# Patient Record
Sex: Male | Born: 1980 | Race: Black or African American | Hispanic: No | Marital: Single | Smoking: Never smoker
Health system: Southern US, Community
[De-identification: ages and names within clinical notes are randomized; demographics above are authoritative.]

## PROBLEM LIST (undated history)

## (undated) DIAGNOSIS — K5090 Crohn's disease, unspecified, without complications: Secondary | ICD-10-CM

## (undated) DIAGNOSIS — I10 Essential (primary) hypertension: Secondary | ICD-10-CM

## (undated) HISTORY — PX: ABDOMINAL SURGERY: SHX537

---

## 2001-08-27 ENCOUNTER — Ambulatory Visit (HOSPITAL_COMMUNITY): Admission: RE | Admit: 2001-08-27 | Discharge: 2001-08-27 | Payer: Self-pay | Admitting: Cardiology

## 2001-08-27 ENCOUNTER — Encounter: Payer: Self-pay | Admitting: Cardiology

## 2017-04-03 ENCOUNTER — Encounter (HOSPITAL_BASED_OUTPATIENT_CLINIC_OR_DEPARTMENT_OTHER): Payer: Self-pay | Admitting: Emergency Medicine

## 2017-04-03 ENCOUNTER — Emergency Department (HOSPITAL_BASED_OUTPATIENT_CLINIC_OR_DEPARTMENT_OTHER)
Admission: EM | Admit: 2017-04-03 | Discharge: 2017-04-03 | Disposition: A | Discharge status: Home / Self Care | Payer: Medicaid Other | Attending: Emergency Medicine | Admitting: Emergency Medicine

## 2017-04-03 DIAGNOSIS — Z202 Contact with and (suspected) exposure to infections with a predominantly sexual mode of transmission: Secondary | ICD-10-CM | POA: Insufficient documentation

## 2017-04-03 DIAGNOSIS — I10 Essential (primary) hypertension: Secondary | ICD-10-CM | POA: Diagnosis not present

## 2017-04-03 HISTORY — DX: Crohn's disease, unspecified, without complications: K50.90

## 2017-04-03 HISTORY — DX: Essential (primary) hypertension: I10

## 2017-04-03 LAB — URINALYSIS, ROUTINE W REFLEX MICROSCOPIC
Bilirubin Urine: NEGATIVE
Glucose, UA: NEGATIVE mg/dL
Hgb urine dipstick: NEGATIVE
Ketones, ur: NEGATIVE mg/dL
Leukocytes, UA: NEGATIVE
Nitrite: NEGATIVE
Protein, ur: NEGATIVE mg/dL
Specific Gravity, Urine: 1.018 (ref 1.005–1.030)
pH: 6.5 (ref 5.0–8.0)

## 2017-04-03 MED ORDER — AZITHROMYCIN 250 MG PO TABS
1000.0000 mg | ORAL_TABLET | Freq: Once | ORAL | Status: AC
  Administered 2017-04-03: 1000 mg via ORAL
  Filled 2017-04-03: qty 4

## 2017-04-03 MED ORDER — ONDANSETRON 8 MG PO TBDP
8.0000 mg | ORAL_TABLET | Freq: Once | ORAL | Status: AC
  Administered 2017-04-03: 8 mg via ORAL
  Filled 2017-04-03: qty 1

## 2017-04-03 MED ORDER — CEFTRIAXONE SODIUM 250 MG IJ SOLR
250.0000 mg | Freq: Once | INTRAMUSCULAR | Status: AC
  Administered 2017-04-03: 250 mg via INTRAMUSCULAR
  Filled 2017-04-03: qty 250

## 2017-04-03 MED ORDER — METRONIDAZOLE 500 MG PO TABS
1000.0000 mg | ORAL_TABLET | Freq: Once | ORAL | Status: AC
  Administered 2017-04-03: 1000 mg via ORAL
  Filled 2017-04-03: qty 2

## 2017-04-03 NOTE — ED Triage Notes (Signed)
Patient reports that he was told yesterday that his girlfirend was positive for Trich. The patient denies any S/S

## 2017-04-03 NOTE — ED Provider Notes (Signed)
[facility] Provider Note   CSN: [id number] Arrival date & time: 04/03/17  1007     History   Chief Complaint Chief Complaint  Patient presents with  . Exposure to STD    HPI Mark Brennan is a 36 y.o. male.  Patient presents for STD check after finding out that his partner had Trichomonas yesterday. He states that she was tested and treated for "some things and came home with some medication and got medication at the ED." He denies any symptoms at this time including abdominal pain, penile discharge, urinary symptoms, however he states he would like to get tested for gonorrhea and Chlamydia as well as Trichomonas. He denies any other complaints at this time.      Past Medical History:  Diagnosis Date  . Crohn's disease (HCC)   . Hypertension     There are no active problems to display for this patient.   Past Surgical History:  Procedure Laterality Date  . ABDOMINAL SURGERY         Home Medications    Prior to Admission medications   Medication Sig Start Date End Date Taking? Authorizing Provider  InFLIXimab (REMICADE IV) Inject into the vein.   Yes Historical Provider, MD    Family History History reviewed. No pertinent family history.  Social History Social History  Substance Use Topics  . Smoking status: Never Smoker  . Smokeless tobacco: Never Used  . Alcohol use No     Allergies   Patient has no known allergies.   Review of Systems Review of Systems  Constitutional: Negative for chills and fever.  Respiratory: Negative for cough, chest tightness, shortness of breath and wheezing.   Cardiovascular: Negative for chest pain and palpitations.  Gastrointestinal: Negative for abdominal pain, nausea and vomiting.  Genitourinary: Negative for discharge, dysuria, genital sores, hematuria, penile pain, penile swelling, scrotal swelling, testicular pain and urgency.  Skin: Negative for rash.  Neurological: Negative for dizziness,  weakness and light-headedness.     Physical Exam Updated Vital Signs BP (!) 162/102 (BP Location: Right Arm)   Pulse (!) 107   Temp 98.2 F (36.8 C) (Oral)   Resp 18   Ht  (1.803 m)   Wt 108 kg   SpO2 100%   BMI 33.19 kg/m   Physical Exam  Constitutional: He appears well-developed and well-nourished. No distress.  HENT:  Head: Normocephalic and atraumatic.  Nose: Nose normal.  Eyes: Conjunctivae and EOM are normal. Right eye exhibits no discharge. Left eye exhibits no discharge. No scleral icterus.  Neck: Normal range of motion. Neck supple.  Cardiovascular: Normal rate, regular rhythm, normal heart sounds and intact distal pulses.  Exam reveals no gallop and no friction rub.   No murmur heard. Pulmonary/Chest: Effort normal and breath sounds normal. No respiratory distress.  Abdominal: Soft. Bowel sounds are normal. He exhibits no distension. There is no tenderness. There is no guarding.  Genitourinary: Penis normal. Circumcised. No penile tenderness.  Musculoskeletal: Normal range of motion. He exhibits no edema.  Neurological: He is alert. He exhibits normal muscle tone. Coordination normal.  Skin: Skin is warm and dry. No rash noted.  Psychiatric: He has a normal mood and affect.  Nursing note and vitals reviewed.    ED Treatments / Results  Labs (all labs ordered are listed, but only abnormal results are displayed) Labs Reviewed  URINALYSIS, ROUTINE W REFLEX MICROSCOPIC  GC/CHLAMYDIA PROBE AMP ([place]) NOT AT [facility]    EKG  EKG Interpretation None       Radiology No results found.  Procedures Procedures (including critical care time)  Medications Ordered in ED Medications  azithromycin (ZITHROMAX) tablet 1,000 mg (not administered)  cefTRIAXone (ROCEPHIN) injection 250 mg (not administered)  metroNIDAZOLE (FLAGYL) tablet 1,000 mg (not administered)  ondansetron (ZOFRAN-ODT) disintegrating tablet 8 mg (not administered)     Initial  Impression / Assessment and Plan / ED Course  I have reviewed the triage vital signs and the nursing notes.  Pertinent labs & imaging results that were available during my care of the patient were reviewed by me and considered in my medical decision making (see chart for details).     Patient presents for STD check. Urinalysis was negative for Trichomonas. He shouldn't is asymptomatic at this time. Obtained urethral swab to check for gonorrhea and chlamydia today. Because these results will not return, we'll treat empirically at this time for GC, chlamydia and trichomonas. Patient advised to follow-up with GC chlamydia results when available. Return precautions given.   Final Clinical Impressions(s) / ED Diagnoses   Final diagnoses:  STD exposure    New Prescriptions New Prescriptions   No medications on file     Dietrich Pates, PA-C 04/03/17 [address], PA-C 04/03/17 1114    Gwyneth Sprout, MD 04/03/17 1547

## 2017-04-03 NOTE — Discharge Instructions (Signed)
Please reattach information regarding condition. Return to ED for worsening symptoms, abdominal pain, fevers, loss of consciousness, trouble breathing, chest pain.

## 2017-04-04 LAB — GC/CHLAMYDIA PROBE AMP (~~LOC~~) NOT AT ARMC
Chlamydia: NEGATIVE
Neisseria Gonorrhea: NEGATIVE

## 2017-04-17 ENCOUNTER — Emergency Department (HOSPITAL_BASED_OUTPATIENT_CLINIC_OR_DEPARTMENT_OTHER): Payer: Medicaid Other

## 2017-04-17 ENCOUNTER — Emergency Department (HOSPITAL_BASED_OUTPATIENT_CLINIC_OR_DEPARTMENT_OTHER)
Admission: EM | Admit: 2017-04-17 | Discharge: 2017-04-17 | Disposition: A | Discharge status: Home / Self Care | Payer: Medicaid Other | Attending: Emergency Medicine | Admitting: Emergency Medicine

## 2017-04-17 ENCOUNTER — Encounter (HOSPITAL_BASED_OUTPATIENT_CLINIC_OR_DEPARTMENT_OTHER): Payer: Self-pay | Admitting: Emergency Medicine

## 2017-04-17 DIAGNOSIS — Y92000 Kitchen of unspecified non-institutional (private) residence as  the place of occurrence of the external cause: Secondary | ICD-10-CM | POA: Diagnosis not present

## 2017-04-17 DIAGNOSIS — S92302A Fracture of unspecified metatarsal bone(s), left foot, initial encounter for closed fracture: Secondary | ICD-10-CM

## 2017-04-17 DIAGNOSIS — Y999 Unspecified external cause status: Secondary | ICD-10-CM | POA: Insufficient documentation

## 2017-04-17 DIAGNOSIS — W010XXA Fall on same level from slipping, tripping and stumbling without subsequent striking against object, initial encounter: Secondary | ICD-10-CM | POA: Diagnosis not present

## 2017-04-17 DIAGNOSIS — S99922A Unspecified injury of left foot, initial encounter: Secondary | ICD-10-CM

## 2017-04-17 DIAGNOSIS — I10 Essential (primary) hypertension: Secondary | ICD-10-CM | POA: Diagnosis not present

## 2017-04-17 DIAGNOSIS — S92325A Nondisplaced fracture of second metatarsal bone, left foot, initial encounter for closed fracture: Secondary | ICD-10-CM | POA: Diagnosis not present

## 2017-04-17 DIAGNOSIS — S92345A Nondisplaced fracture of fourth metatarsal bone, left foot, initial encounter for closed fracture: Secondary | ICD-10-CM | POA: Insufficient documentation

## 2017-04-17 DIAGNOSIS — Y939 Activity, unspecified: Secondary | ICD-10-CM | POA: Insufficient documentation

## 2017-04-17 MED ORDER — MORPHINE SULFATE (PF) 4 MG/ML IV SOLN: 4.0000 mg | Freq: Once | INTRAVENOUS | Status: DC

## 2017-04-17 MED ORDER — HYDROCODONE-ACETAMINOPHEN 5-325 MG PO TABS: 1.0000 | ORAL_TABLET | Freq: Four times a day (QID) | ORAL | 0 refills | Status: DC | PRN

## 2017-04-17 MED ORDER — MORPHINE SULFATE (PF) 4 MG/ML IV SOLN
4.0000 mg | Freq: Once | INTRAVENOUS | Status: AC
  Administered 2017-04-17: 4 mg via INTRAMUSCULAR
  Filled 2017-04-17: qty 1

## 2017-04-17 NOTE — Discharge Instructions (Signed)
Please [facility] foot [facility]. Need to follow-up this week or next at the latest.

## 2017-04-17 NOTE — ED Provider Notes (Signed)
[facility]   CSN: [id number] Arrival date & time: 04/17/17  1527   History   Chief Complaint Chief Complaint  Patient presents with  . Leg Injury    HPI Mark Brennan is a 36 y.o. male.  HPI  Patient presenting with injury to left foot. Patient states that he was trying to wash clothes and slipped on pillowcase in kitchen and fell on his ankle. He felt like he heard something pop. Thought it was the knee but has no knee pain. He had immediate swelling in his ankle. He was unable to bear weight afterwards. He denies any bruising to area. Is having severe pain. Pain shoots up to his mid shin. Pain mainly localized parietal side of his foot. Has not done anything for pain due to presenting to the ED. Has never had injuries to that ankle before.  Past Medical History:  Diagnosis Date  . Crohn's disease (HCC)   . Hypertension    There are no active problems to display for this patient.  Past Surgical History:  Procedure Laterality Date  . ABDOMINAL SURGERY      Home Medications    Prior to Admission medications   Medication Sig Start Date End Date Taking? Authorizing Provider  InFLIXimab (REMICADE IV) Inject into the vein.   Yes Historical Provider, MD    Family History No family history on file.  Social History Social History  Substance Use Topics  . Smoking status: Never Smoker  . Smokeless tobacco: Never Used  . Alcohol use No    Allergies   Patient has no known allergies.  Review of Systems Review of Systems  Constitutional: Negative for chills and fever.  Musculoskeletal: Positive for gait problem and joint swelling.  Neurological: Negative for headaches.  Also per HPI  Physical Exam Updated Vital Signs BP (!) 157/103 (BP Location: Right Arm)   Pulse (!) 116   Temp 98.9 F (37.2 C) (Oral)   Ht 6' (1.829 m)   Wt 108 kg   SpO2 98%   BMI 32.28 kg/m   Physical Exam  Constitutional: He is oriented to person, place, and  time. He appears well-developed and well-nourished. No distress.  HENT:  Head: Normocephalic and atraumatic.  Eyes: EOM are normal.  Neck: Normal range of motion.  Cardiovascular: Intact distal pulses.  Tachycardia present.   Pulmonary/Chest: Effort normal.  Musculoskeletal:       Left ankle: He exhibits swelling. He exhibits no ecchymosis and normal pulse. Tenderness.       Feet:  Neurological: He is alert and oriented to person, place, and time. A sensory deficit is present. No cranial nerve deficit.  Decreased sensation along the lateral side of left foot. Decreased strength testing and ROM due to pain being limiting factor.     ED Treatments / Results  Labs (all labs ordered are listed, but only abnormal results are displayed) Labs Reviewed - No data to display  EKG  EKG Interpretation None       Radiology Ct Foot Left Wo Contrast  Result Date: 04/17/2017 CLINICAL DATA:  Patient tripped and fell over a pillow twisting the left foot. Concern for Lisfranc fracture. EXAM: CT OF THE LEFT FOOT WITHOUT CONTRAST TECHNIQUE: Multidetector CT imaging of the left foot was performed according to the standard protocol. Multiplanar CT image reconstructions were also generated. COMPARISON:  None. FINDINGS: Bones/Joint/Cartilage Acute, closed, intra-articular fracture involving the base of the second metatarsal with nondisplaced appearing fracture involving the base  of the fourth metatarsal but without intra-articular involvement are identified. Tiny fracture fragments are seen along the plantar aspect of the second metatarsal fracture between the second and third metatarsal bases. Ankle, subtalar and midfoot articulations are maintained. The alignment of the second metatarsal base with middle cuneiform is also maintained. No significant joint space abnormality or cartilage loss. Ligaments Suboptimally assessed by CT. The Lisfranc ligament, in particular of the interosseous ligament is not optimally  evaluated by CT. MRI would be the study of choice Muscles and Tendons No intramuscular hemorrhage.  No tendon abnormality. Soft tissues Diffuse mild to moderate soft tissue swelling over the dorsum of mid and forefoot. IMPRESSION: 1. Acute, closed fractures involving the base of the second and fourth metatarsals with intra-articular involvement noted of the second metatarsal base fracture extending into the second TMT joint. Tiny fracture fragments are noted off the base of the second metatarsal fracture along the plantar aspect. 2. The alignment of the second metatarsal base with middle cuneiform appears congruent without significant displacement identified. Injury to the Lisfranc ligament however is not well visualized by CT. MRI would be the study of choice for this assessment. Electronically Signed   By: Tollie Ethavid  Kwon M.D.   On: 04/17/2017 17:02   Dg Foot Complete Left  Result Date: 04/17/2017 CLINICAL DATA:  Pain and swelling of the foot after slipping and falling today. EXAM: LEFT FOOT - COMPLETE 3+ VIEW COMPARISON:  None. FINDINGS: Pes planus. Dorsal soft tissue swelling. Small bone fragments evident between the proximal second and third metatarsals consistent with injury in the Lisfranc region. CT may be useful to more accurately characterize the extent of the injury. IMPRESSION: Pes planus. Dorsal midfoot swelling. Small bone fragments between the proximal second and third metatarsals indicating Lisfranc region injury. CT could more accurately characterize the full extent. Electronically Signed   By: Paulina FusiMark  Shogry M.D.   On: 04/17/2017 15:59   Procedures Procedures (including critical care time)  Medications Ordered in ED Medications - No data to display   Initial Impression / Assessment and Plan / ED Course  I have reviewed the triage vital signs and the nursing notes.  Pertinent labs & imaging results that were available during my care of the patient were reviewed by me and considered in my  medical decision making (see chart for details).  Patient presenting with left foot pain after fall. Imaging done that showed injury to the Lisfranc area of left midfoot. On Ct scan has fractures of the second and fourth metatarsal. Fractures are non-displaced. Consulted ortho and discussed care with Dr. Lequita HaltAluisio. Recommended cam walker, crutches and follow-up as an outpatient. Patient given Rx for pain medication. Return precautions given. Stable for discharge with follow-up precautions.   Final Clinical Impressions(s) / ED Diagnoses   Final diagnoses:  Foot injury, left, initial encounter  Closed nondisplaced fracture of metatarsal bone of left foot, unspecified metatarsal, initial encounter    New Prescriptions New Prescriptions   No medications on file     Pincus LargeJazma Y Lorrane Mccay, DO 04/17/17 1754    Canary Brimhristopher J Tegeler, MD 04/18/17 [phone]

## 2017-04-17 NOTE — ED Triage Notes (Addendum)
Pt states he slipped in the kitchen injuring his L leg. c/o L foot and leg pain. Swelling noted to foot.

## 2017-10-24 IMAGING — CT CT FOOT*L* W/O CM
2 of 3 series · 13 of 30 positions shown, 15 images · non-contrast
Comparison: None.

CLINICAL DATA: Patient tripped and fell over Cyril Laboy twisting the
left foot. Concern for Lisfranc fracture.

EXAM:
CT OF THE LEFT FOOT WITHOUT CONTRAST
TECHNIQUE: Multidetector CT imaging of the left foot was performed according to
the standard protocol. Multiplanar CT image reconstructions were
also generated.

[Series 9: axial st · coronal · 0.36mm/px · 8 of 281 slices shown, 9 images]
[im 57/281  soft-tissue]
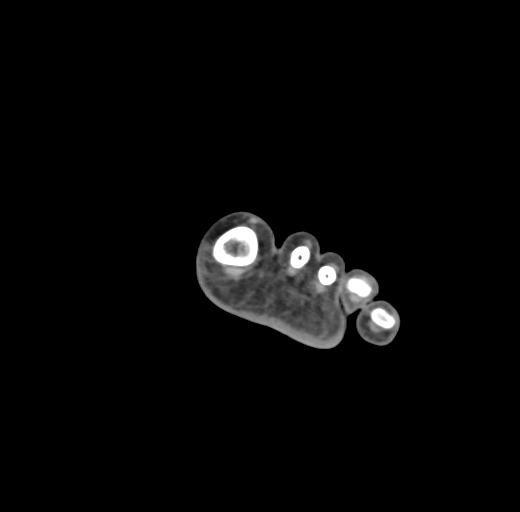
[im 57/281  bone]
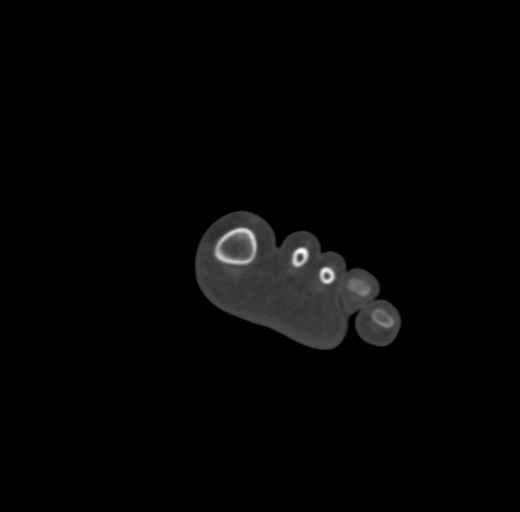
[im 96/281  bone]
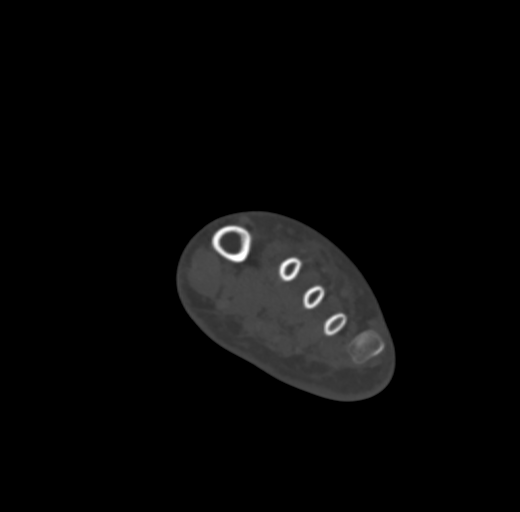
[im 114/281  bone]
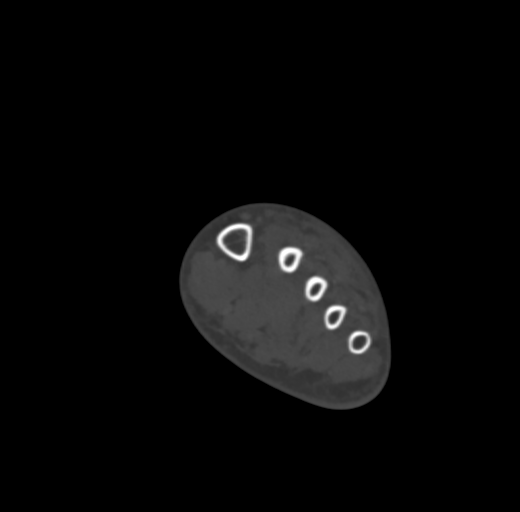
[im 132/281  bone]
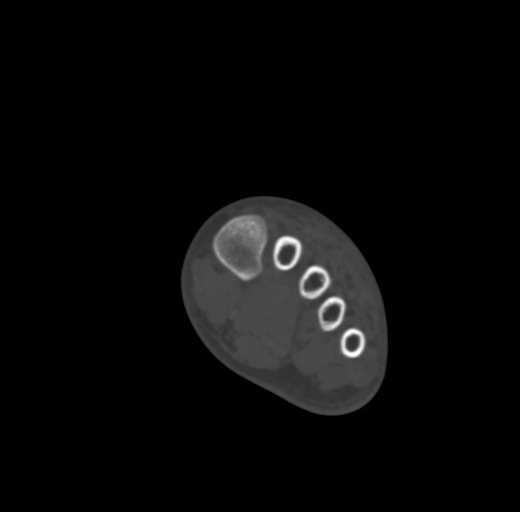
[im 150/281  bone]
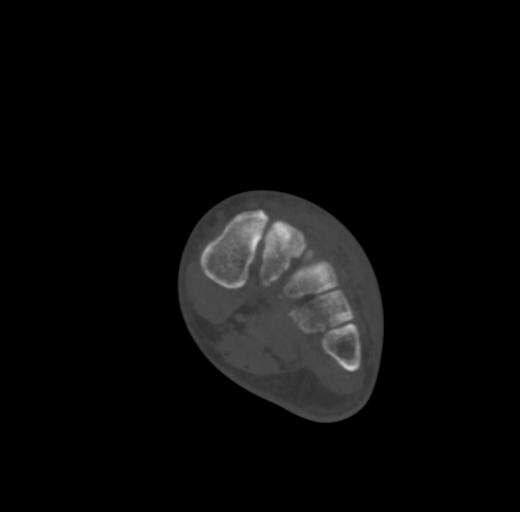
[im 168/281  bone]
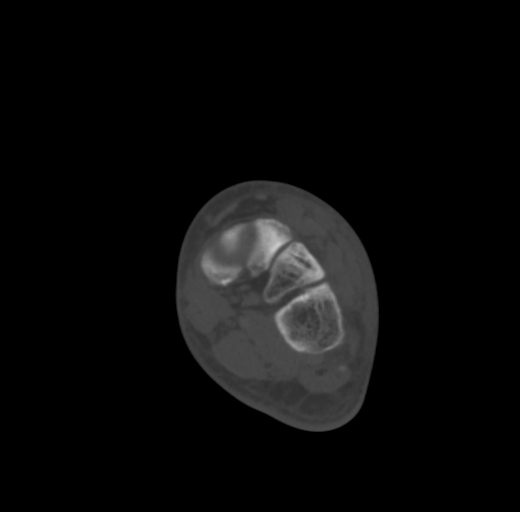
[im 186/281  bone]
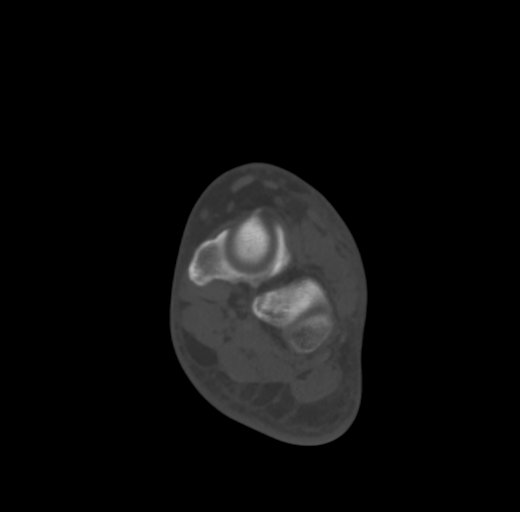
[im 225/281  bone]
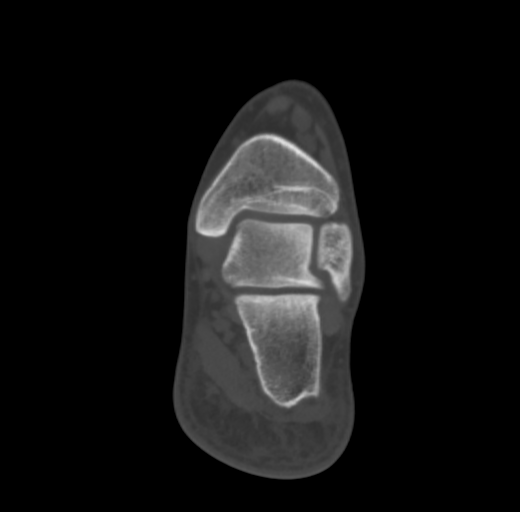

[Series 10: sag st · sagittal · 0.42mm/px · 5 of 103 slices shown, 6 images]
[im 35/103  bone]
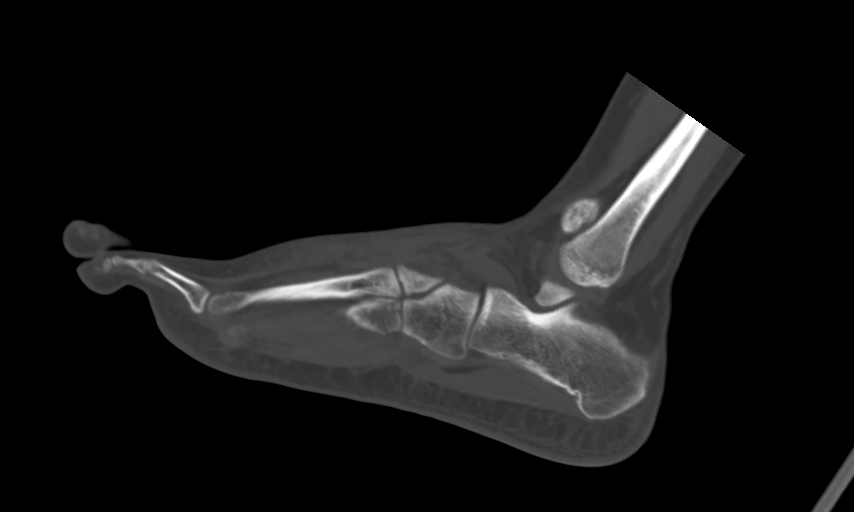
[im 43/103  bone]
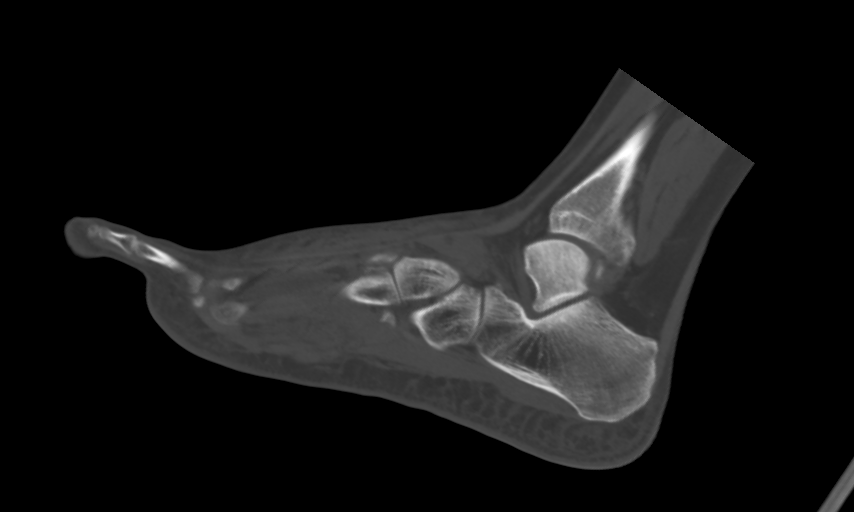
[im 52/103  soft-tissue]
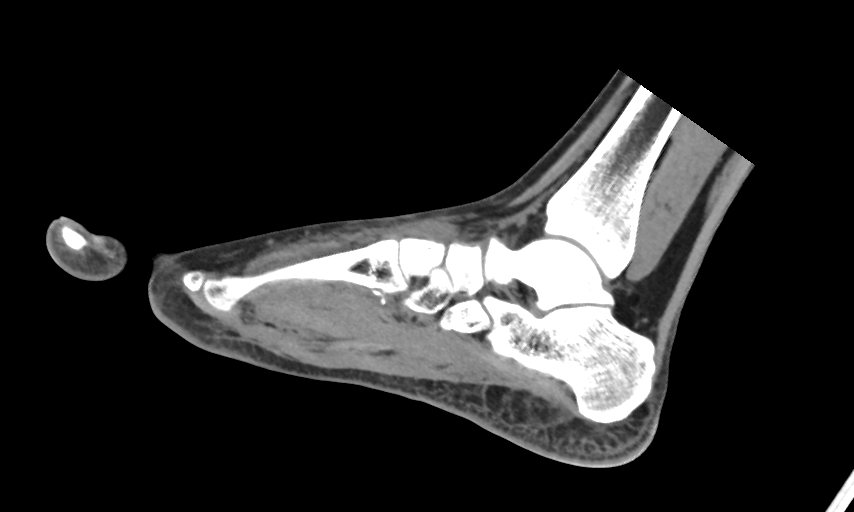
[im 52/103  bone]
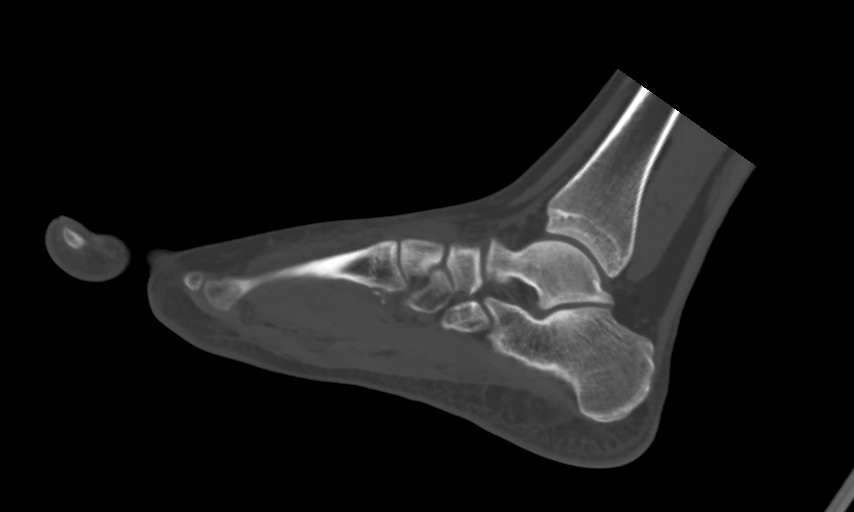
[im 60/103  bone]
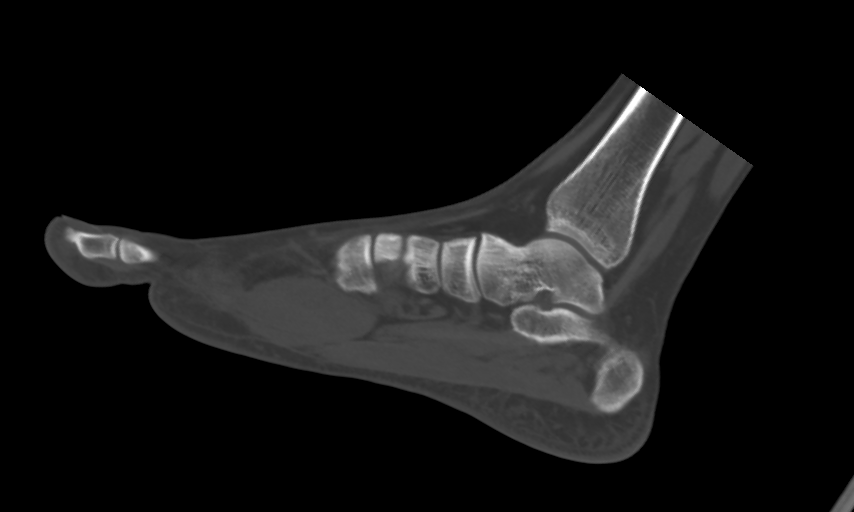
[im 69/103  bone]
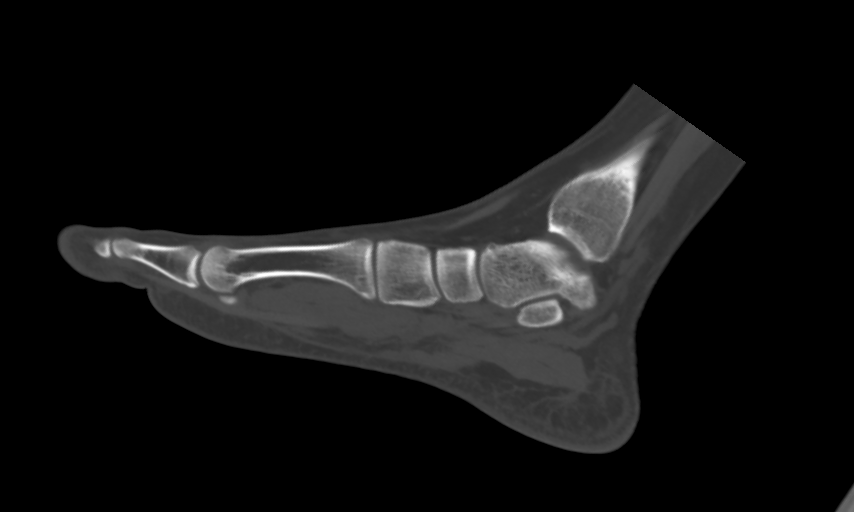

[13 of 30 positions shown; findings below may reference images not displayed]

FINDINGS: Bones/Joint/Cartilage

Acute, closed, intra-articular fracture involving the base of the
second metatarsal with nondisplaced appearing fracture involving the
base of the fourth metatarsal but without intra-articular
involvement are identified. Tiny fracture fragments are seen along
the plantar aspect of the second metatarsal fracture between the
second and third metatarsal bases. Ankle, subtalar and midfoot
articulations are maintained. The alignment of the second metatarsal
base with middle cuneiform is also maintained. No significant joint
space abnormality or cartilage loss.

Ligaments

Suboptimally assessed by CT. The Lisfranc ligament, in particular of
the interosseous ligament is not optimally evaluated by CT. MRI
would be the study of choice

Muscles and Tendons

No intramuscular hemorrhage.  No tendon abnormality.

Soft tissues

Diffuse mild to moderate soft tissue swelling over the dorsum of mid
and forefoot.
IMPRESSION: 1. Acute, closed fractures involving the base of the second and
fourth metatarsals with intra-articular involvement noted of the
second metatarsal base fracture extending into the second TMT joint.
Tiny fracture fragments are noted off the base of the second
metatarsal fracture along the plantar aspect.
2. The alignment of the second metatarsal base with middle cuneiform
appears congruent without significant displacement identified.
Injury to the Lisfranc ligament however is not well visualized by
CT. MRI would be the study of choice for this assessment.

## 2018-01-02 ENCOUNTER — Encounter (HOSPITAL_BASED_OUTPATIENT_CLINIC_OR_DEPARTMENT_OTHER): Payer: Self-pay

## 2018-01-02 ENCOUNTER — Emergency Department (HOSPITAL_BASED_OUTPATIENT_CLINIC_OR_DEPARTMENT_OTHER): Payer: Medicaid Other

## 2018-01-02 ENCOUNTER — Emergency Department (HOSPITAL_BASED_OUTPATIENT_CLINIC_OR_DEPARTMENT_OTHER)
Admission: EM | Admit: 2018-01-02 | Discharge: 2018-01-03 | Disposition: A | Discharge status: Home / Self Care | Payer: Medicaid Other | Attending: Emergency Medicine | Admitting: Emergency Medicine

## 2018-01-02 ENCOUNTER — Other Ambulatory Visit: Payer: Self-pay

## 2018-01-02 DIAGNOSIS — N200 Calculus of kidney: Secondary | ICD-10-CM | POA: Insufficient documentation

## 2018-01-02 DIAGNOSIS — Z79899 Other long term (current) drug therapy: Secondary | ICD-10-CM | POA: Insufficient documentation

## 2018-01-02 DIAGNOSIS — R1031 Right lower quadrant pain: Secondary | ICD-10-CM | POA: Diagnosis present

## 2018-01-02 DIAGNOSIS — I10 Essential (primary) hypertension: Secondary | ICD-10-CM | POA: Diagnosis not present

## 2018-01-02 LAB — URINALYSIS, ROUTINE W REFLEX MICROSCOPIC
Bilirubin Urine: NEGATIVE
Glucose, UA: NEGATIVE mg/dL
Ketones, ur: NEGATIVE mg/dL
Leukocytes, UA: NEGATIVE
NITRITE: NEGATIVE
PROTEIN: NEGATIVE mg/dL
SPECIFIC GRAVITY, URINE: 1.015 (ref 1.005–1.030)
pH: 6 (ref 5.0–8.0)

## 2018-01-02 LAB — BASIC METABOLIC PANEL
Anion gap: 8 (ref 5–15)
BUN: 12 mg/dL (ref 6–20)
CALCIUM: 8.8 mg/dL — AB (ref 8.9–10.3)
CO2: 27 mmol/L (ref 22–32)
Chloride: 104 mmol/L (ref 101–111)
Creatinine, Ser: 1.38 mg/dL — ABNORMAL HIGH (ref 0.61–1.24)
Glucose, Bld: 98 mg/dL (ref 65–99)
Potassium: 2.8 mmol/L — ABNORMAL LOW (ref 3.5–5.1)
SODIUM: 139 mmol/L (ref 135–145)

## 2018-01-02 LAB — CBC
HCT: 42.8 % (ref 39.0–52.0)
HEMOGLOBIN: 14.9 g/dL (ref 13.0–17.0)
MCH: 31.2 pg (ref 26.0–34.0)
MCHC: 34.8 g/dL (ref 30.0–36.0)
MCV: 89.7 fL (ref 78.0–100.0)
PLATELETS: 329 10*3/uL (ref 150–400)
RBC: 4.77 MIL/uL (ref 4.22–5.81)
RDW: 13.9 % (ref 11.5–15.5)
WBC: 11.3 10*3/uL — ABNORMAL HIGH (ref 4.0–10.5)

## 2018-01-02 LAB — URINALYSIS, MICROSCOPIC (REFLEX): WBC UA: NONE SEEN WBC/hpf (ref 0–5)

## 2018-01-02 NOTE — ED Triage Notes (Signed)
C/o right flank pain-states [facility]-states he is still having pain-rx abx, pain med-completed abx and pain med-NAD-steady gait

## 2018-01-02 NOTE — ED Notes (Signed)
To US by stretcher, NAD, calm, no changes.

## 2018-01-02 NOTE — ED Notes (Addendum)
Up to b/r, steady gait.Alert, NAD, calm, interactive. No changes.

## 2018-01-02 NOTE — ED Provider Notes (Signed)
[facility] EMERGENCY DEPARTMENT Provider Note   CSN: [id number] Arrival date & time: 01/02/18  2059     History   Chief Complaint Chief Complaint  Patient presents with  . Flank Pain    HPI Mark Brennan is a 37 y.o. male who presents the emergency department chief complaint of right flank pain.  Patient was diagnosed with a UVJ 4 mm ureteral stone.  He was sent home with Vantin likely because he is immunocompromise from medications he takes for his Crohn's disease, pain medications and Flomax with improvement.  Patient was symptom-free until this morning when he awoke from sleep around 4 AM with severe pain in his right lower quadrant that felt exactly the same as his previous stone.  The pain was colicky and sharp.  He denies any vomiting but had difficulty sitting still.  He states that his pain lasted for about 4 hours and then improved however he continues to have some achy pain in his right lower quadrant and feels stinging pain at his urethral meatus when urinating.  He denies fevers, chills, back pain.   HPI  Past Medical History:  Diagnosis Date  . Crohn's disease (HCC)   . Hypertension     There are no active problems to display for this patient.   Past Surgical History:  Procedure Laterality Date  . ABDOMINAL SURGERY         Home Medications    Prior to Admission medications   Medication Sig Start Date End Date Taking? Authorizing Provider  InFLIXimab (REMICADE IV) Inject into the vein.    [provider]    Family History No family history on file.  Social History Social History   Tobacco Use  . Smoking status: Never Smoker  . Smokeless tobacco: Never Used  Substance Use Topics  . Alcohol use: No  . Drug use: No     Allergies   Patient has no known allergies.   Review of Systems Review of Systems  Ten systems reviewed and are negative for acute change, except as noted in the HPI.   Physical Exam Updated Vital  Signs BP (!) 166/104 (BP Location: Left Arm)   Pulse 73   Temp 98.4 F (36.9 C) (Oral)   Resp 18   Ht 6' (1.829 m)   Wt 103.9 kg (229 lb)   SpO2 100%   BMI 31.06 kg/m   Physical Exam  Constitutional: He appears well-developed and well-nourished. No distress.  HENT:  Head: Normocephalic and atraumatic.  Eyes: Conjunctivae are normal. No scleral icterus.  Neck: Normal range of motion. Neck supple.  Cardiovascular: Normal rate, regular rhythm and normal heart sounds.  Pulmonary/Chest: Effort normal and breath sounds normal. No respiratory distress.  Abdominal: Soft. There is no tenderness.  Musculoskeletal: He exhibits no edema.  Neurological: He is alert.  Skin: Skin is warm and dry. He is not diaphoretic.  Psychiatric: His behavior is normal.  Nursing note and vitals reviewed.    ED Treatments / Results  Labs (all labs ordered are listed, but only abnormal results are displayed) Labs Reviewed  BASIC METABOLIC PANEL  CBC  URINALYSIS, ROUTINE W REFLEX MICROSCOPIC    EKG  EKG Interpretation None       Radiology No results found.  Procedures Procedures (including critical care time)  Medications Ordered in ED Medications - No data to display   Initial Impression / Assessment and Plan / ED Course  I have reviewed the triage vital signs and  the nursing notes.  Pertinent labs & imaging results that were available during my care of the patient were reviewed by me and considered in my medical decision making (see chart for details).     Patient renal ultrasound shows persistent 5 mm stone in the UVJ.  His pain is well controlled at this time without any pain intervention here in the emergency department.  Patient will be discharged with pain medication and anti-nausea medicine as well as Flomax.  He does not appear to have any signs of infection.  His hydronephrosis seems to have improved since his previous CT scan.  The patient is advised to follow closely with  his urologist and to call them directly and to be seen more urgently.  If he is unable or his pain returns [facility] or [facility] department for further workup.  He is a slight uptake in his creatinine however it is clinically negligible.  Discussed return precautions.  He appears appropriate for discharge at this time.  Afebrile and hemodynamically stable.  Final Clinical Impressions(s) / ED Diagnoses   Final diagnoses:  None    ED Discharge Orders    None       Arthor CaptainHarris, Jizelle Conkey, PA-C 01/03/18 [id number]    Gwyneth SproutPlunkett, Whitney, MD 01/03/18 1108

## 2018-01-02 NOTE — ED Notes (Signed)
Alert, NAD, calm, interactive, resps e/u, speaking in clear complete sentences, no dyspnea noted, skin W&D, VSS, c/o R flank pain only, onset this am, states maybe some frequency, (denies: abd or groin pain, fever, NVD, bleeding, hematuria, dysuria, urgency, incomplete emptying, penile d/c, rash, itching, injury, sob, dizziness or visual changes). No meds PTA. Finished ABT, flomax and pain med[facility] BP noted to be high, states h/o similar.

## 2018-01-02 NOTE — ED Notes (Signed)
Back from US, no changes.  

## 2018-01-03 MED ORDER — ONDANSETRON HCL 4 MG PO TABS: 4.0000 mg | ORAL_TABLET | Freq: Four times a day (QID) | ORAL | 0 refills | Status: AC | PRN

## 2018-01-03 MED ORDER — OXYCODONE-ACETAMINOPHEN 5-325 MG PO TABS: 1.0000 | ORAL_TABLET | ORAL | 0 refills | Status: AC | PRN

## 2018-01-03 MED ORDER — TAMSULOSIN HCL 0.4 MG PO CAPS: 0.4000 mg | ORAL_CAPSULE | Freq: Two times a day (BID) | ORAL | 0 refills | Status: AC

## 2018-01-03 NOTE — Discharge Instructions (Signed)
Return to the ED immediately if you develop fever, uncontrolled pain or vomiting, or other concerns. ° °

## 2018-05-05 IMAGING — US US RENAL
1 series · 13 of 25 positions shown · non-contrast
Comparison: Abdominal CT dated 12/23/2017

CLINICAL DATA: 37-year-old male with right flank pain.

EXAM:
RENAL / URINARY TRACT ULTRASOUND COMPLETE

[Series 1: us renal · 0.24mm/px · 13 of 29 slices shown]
[im 1/29]
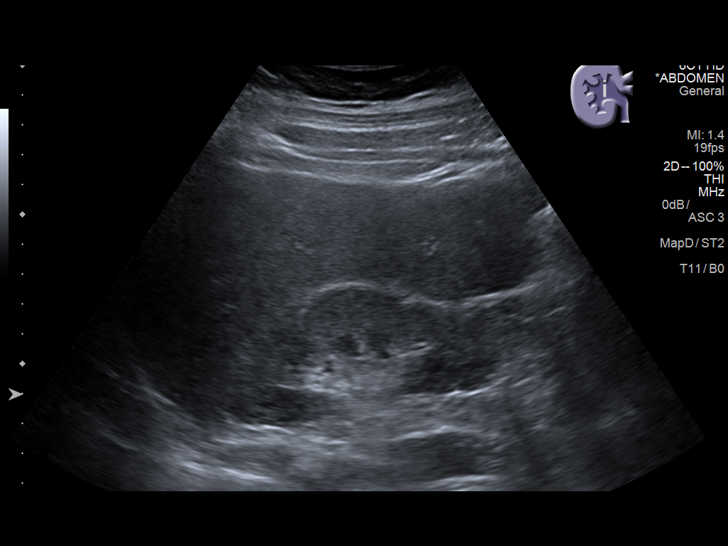
[im 3/29]
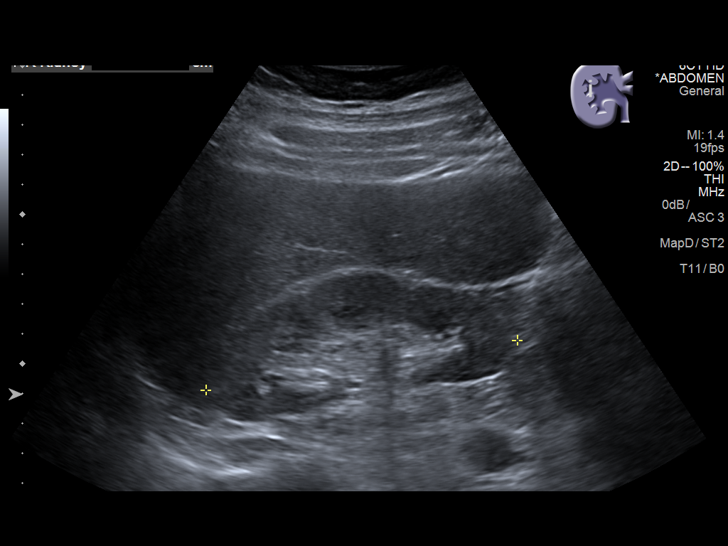
[im 5/29]
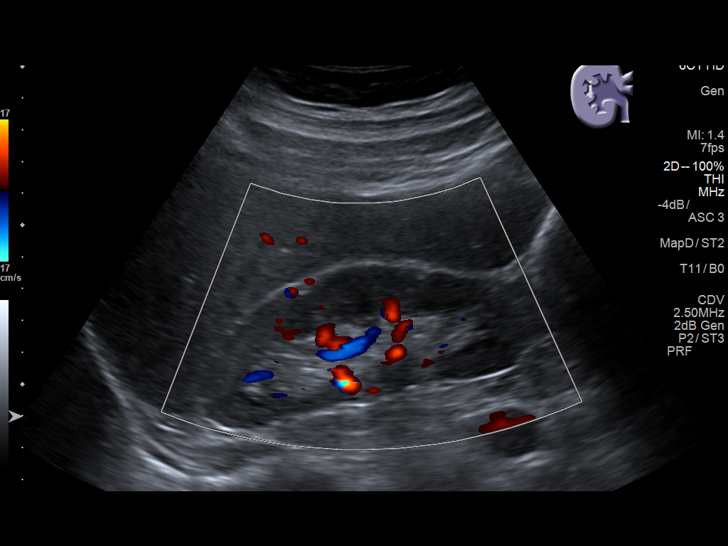
[im 8/29]
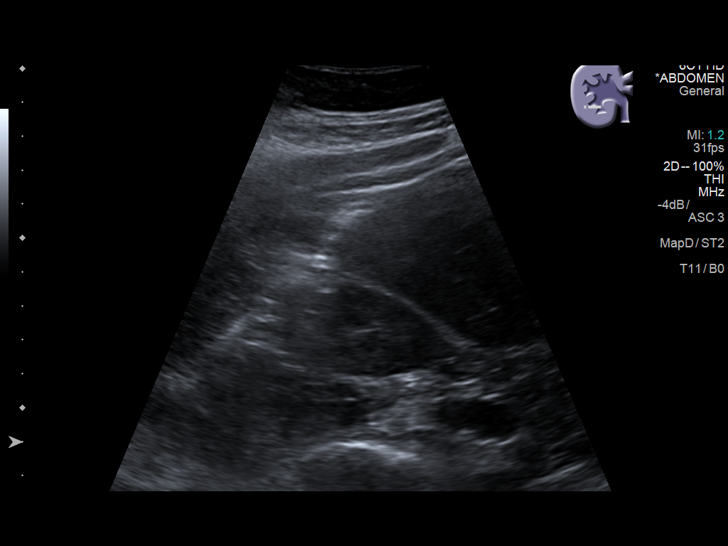
[im 10/29]
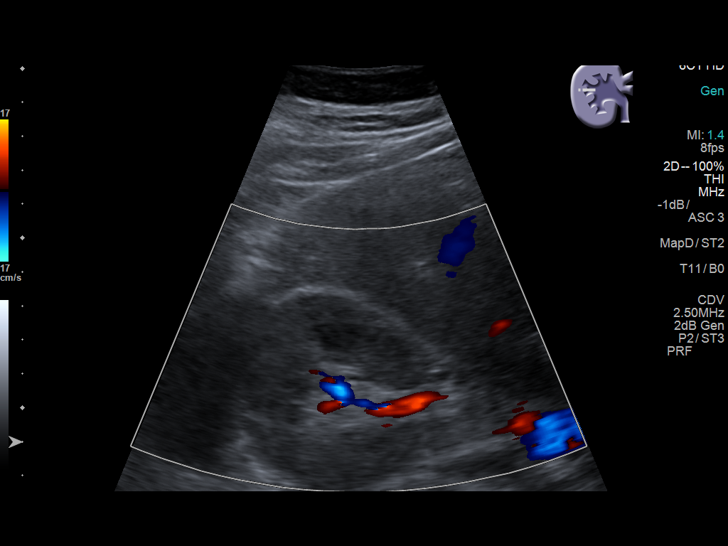
[im 12/29]
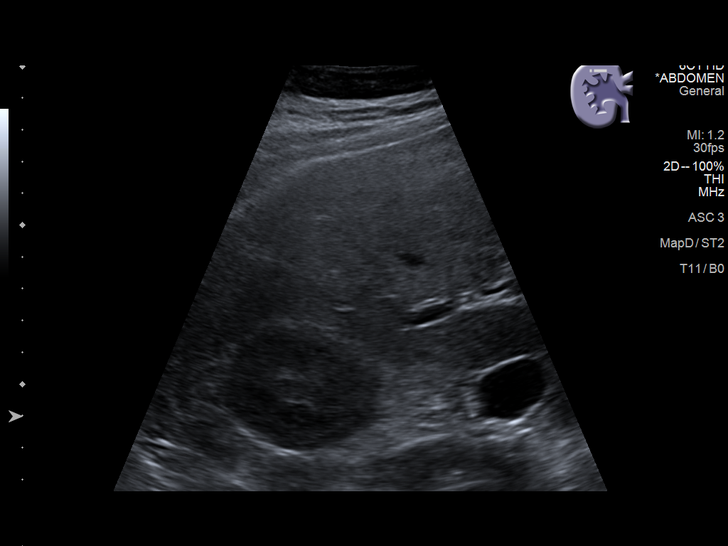
[im 15/29]
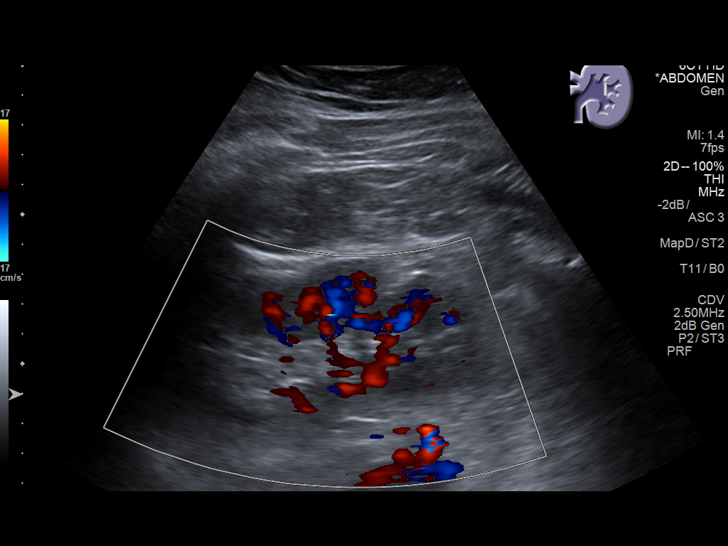
[im 17/29]
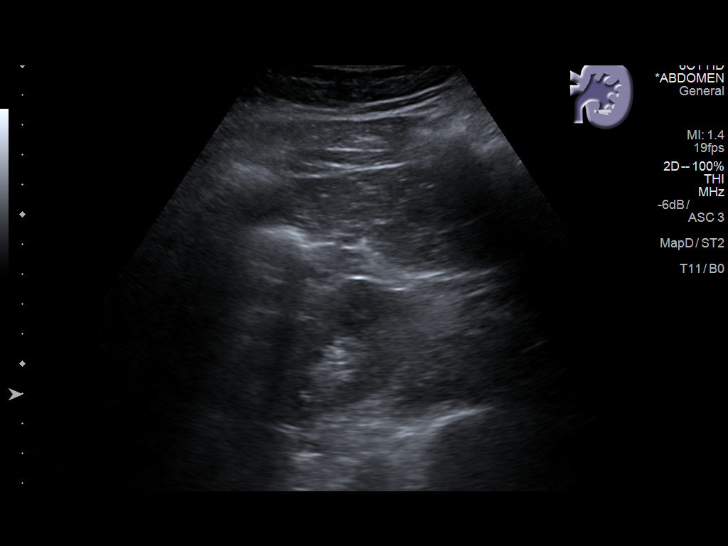
[im 19/29]
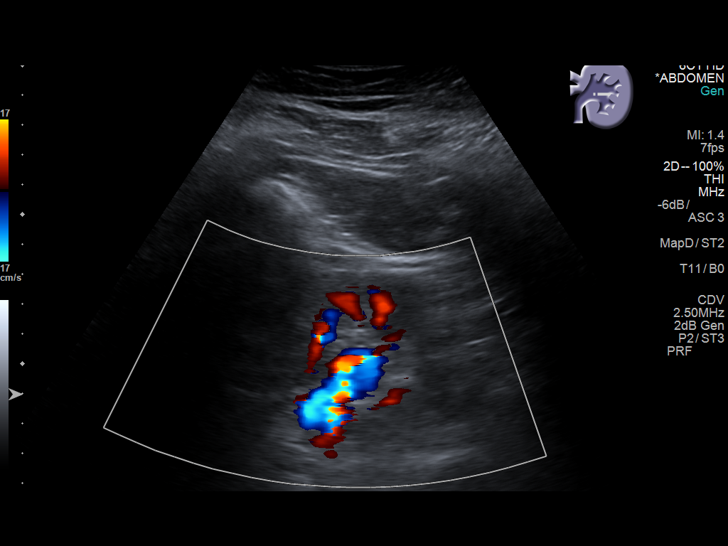
[im 22/29]
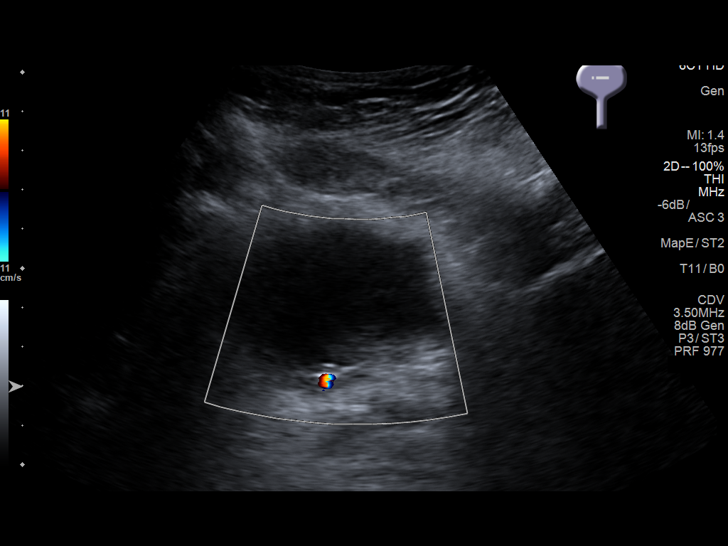
[im 24/29]
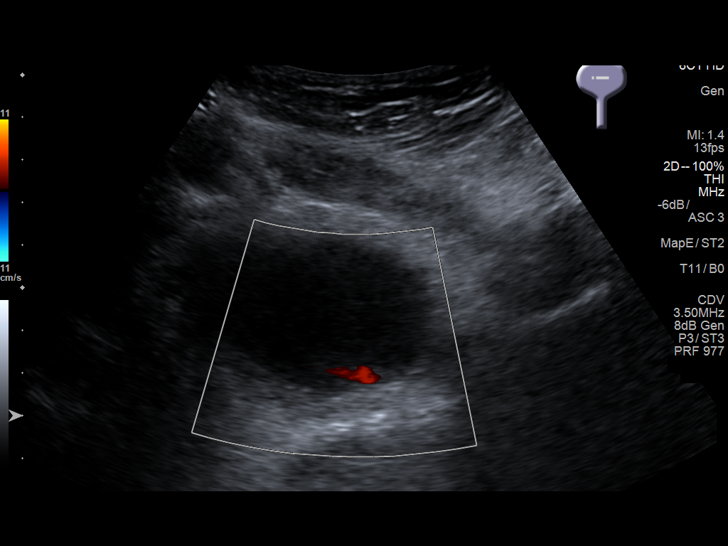
[im 26/29]
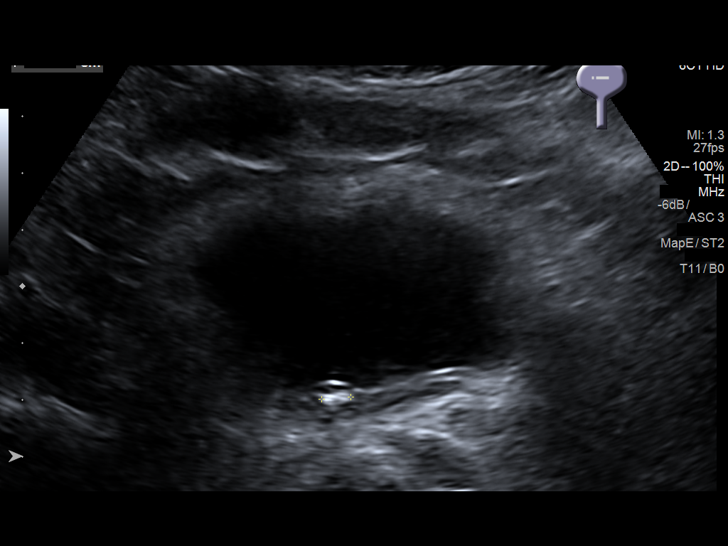
[im 29/29]
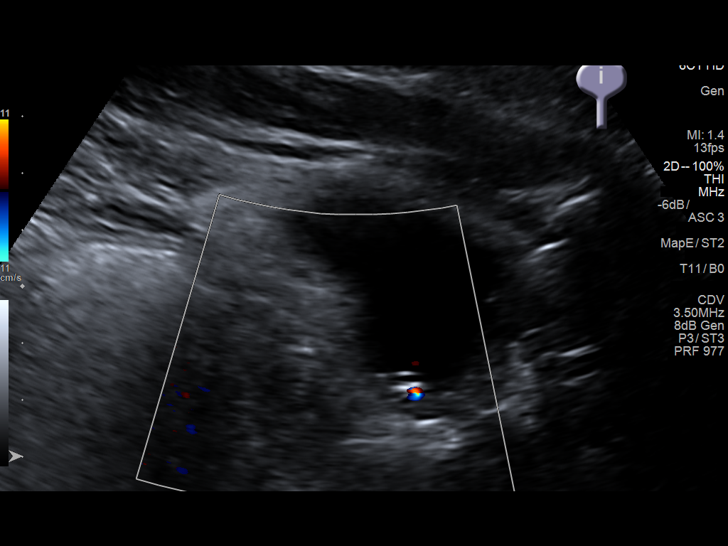

[13 of 25 positions shown; findings below may reference images not displayed]

FINDINGS: Right Kidney:

Length: 11 cm. Normal echogenicity. There is minimal fullness of the
renal pelvis and proximal ureter without hydronephrosis. There has
been interval resolution of the mild hydronephrosis seen on the
prior CT. No shadowing stone.

Left Kidney:

Length: 11 cm. Normal echogenicity no hydronephrosis or shadowing
stone.

Bladder:

There is a 5 mm shadowing echogenic focus along the posterior wall
of the bladder adjacent in the region of the right ureterovesical
junction most likely representing the stone seen in the distal right
ureter on the prior CT. This stone appears to be at the UVJ as the
right ureteral jet is not visualized. The left ureteral jet is seen.
The urinary bladder is otherwise unremarkable.
IMPRESSION: 1. A 5 mm probable right UVJ calculus likely corresponding to the
distal ureteral stone seen on the prior CT. There is minimal
fullness of the right renal pelvis and proximal ureter with near
complete resolution of the right hydronephrosis seen on the prior
CT. Clinical correlation and continued follow-up as clinically
indicated recommended.
2. No hydronephrosis or shadowing stone on the left.

## 2018-07-04 ENCOUNTER — Encounter (HOSPITAL_BASED_OUTPATIENT_CLINIC_OR_DEPARTMENT_OTHER): Payer: Self-pay | Admitting: Emergency Medicine

## 2018-07-04 ENCOUNTER — Other Ambulatory Visit: Payer: Self-pay

## 2018-07-04 ENCOUNTER — Emergency Department (HOSPITAL_BASED_OUTPATIENT_CLINIC_OR_DEPARTMENT_OTHER)
Admission: EM | Admit: 2018-07-04 | Discharge: 2018-07-04 | Disposition: A | Discharge status: Home / Self Care | Payer: Medicaid Other | Attending: Emergency Medicine | Admitting: Emergency Medicine

## 2018-07-04 DIAGNOSIS — I10 Essential (primary) hypertension: Secondary | ICD-10-CM | POA: Diagnosis not present

## 2018-07-04 DIAGNOSIS — H9201 Otalgia, right ear: Secondary | ICD-10-CM

## 2018-07-04 DIAGNOSIS — J069 Acute upper respiratory infection, unspecified: Secondary | ICD-10-CM

## 2018-07-04 DIAGNOSIS — L0100 Impetigo, unspecified: Secondary | ICD-10-CM | POA: Diagnosis not present

## 2018-07-04 LAB — RAPID STREP SCREEN (MED CTR MEBANE ONLY): STREPTOCOCCUS, GROUP A SCREEN (DIRECT): NEGATIVE

## 2018-07-04 MED ORDER — BENZONATATE 100 MG PO CAPS: 100.0000 mg | ORAL_CAPSULE | Freq: Three times a day (TID) | ORAL | 0 refills | Status: AC

## 2018-07-04 MED ORDER — ACETAMINOPHEN ER 650 MG PO TBCR: 650.0000 mg | EXTENDED_RELEASE_TABLET | Freq: Three times a day (TID) | ORAL | 0 refills | Status: AC | PRN

## 2018-07-04 MED ORDER — FLUTICASONE PROPIONATE 50 MCG/ACT NA SUSP: 1.0000 | Freq: Every day | NASAL | 0 refills | Status: AC

## 2018-07-04 MED ORDER — MUPIROCIN CALCIUM 2 % EX CREA: 1.0000 "application " | TOPICAL_CREAM | Freq: Three times a day (TID) | CUTANEOUS | 0 refills | Status: AC

## 2018-07-04 NOTE — ED Triage Notes (Signed)
R ear pain x 2 days.

## 2018-07-04 NOTE — ED Provider Notes (Signed)
[facility] EMERGENCY DEPARTMENT Provider Note   CSN: [id number] Arrival date & time: 07/04/18  1031     History   Chief Complaint Chief Complaint  Patient presents with  . Otalgia    HPI Mark Brennan is a 37 y.o. male with a hx of Crohn's disease and HTN who presents to the ED with complaints of R ear pain x 3 days.  Patient states he is having URI symptoms including congestion, right ear pain, sore throat, and dry cough. Sxs progressively worsening. Overall pain a 5/10 in severity. No specific alleviating/aggravating factors.  He has not tried intervention prior to arrival. Denies fever, nausea, vomiting, change in voice, inability to swallow, drainage form the ear or change in hearing. Denies tic exposure or rashes to palms/soles. He does mention that there is an area of dry skin on the posterior aspect of the R ear that has waxed/waned for several months.   HPI  Past Medical History:  Diagnosis Date  . Crohn's disease (HCC)   . Hypertension     There are no active problems to display for this patient.   Past Surgical History:  Procedure Laterality Date  . ABDOMINAL SURGERY          Home Medications    Prior to Admission medications   Medication Sig Start Date End Date Taking? Authorizing Provider  InFLIXimab (REMICADE IV) Inject into the vein.    [provider]  ondansetron (ZOFRAN) 4 MG tablet Take 1 tablet (4 mg total) by mouth 4 (four) times daily as needed for nausea or vomiting. 01/03/18   Arthor CaptainHarris, Abigail, PA-C  oxyCODONE-acetaminophen (PERCOCET) 5-325 MG tablet Take 1-2 tablets by mouth every 4 (four) hours as needed. 01/03/18   Arthor CaptainHarris, Abigail, PA-C  tamsulosin (FLOMAX) 0.4 MG CAPS capsule Take 1 capsule (0.4 mg total) by mouth 2 (two) times daily. 01/03/18   Arthor CaptainHarris, Abigail, PA-C    Family History No family history on file.  Social History Social History   Tobacco Use  . Smoking status: Never Smoker  . Smokeless tobacco: Never  Used  Substance Use Topics  . Alcohol use: No  . Drug use: No     Allergies   Patient has no known allergies.   Review of Systems Review of Systems  Constitutional: Negative for chills and fever.  HENT: Positive for congestion, ear pain, rhinorrhea and sore throat. Negative for dental problem, drooling, ear discharge, facial swelling, hearing loss, trouble swallowing and voice change.   Respiratory: Positive for cough. Negative for shortness of breath.   Cardiovascular: Negative for chest pain.  Gastrointestinal: Negative for nausea and vomiting.  Skin: Positive for rash (dry skin to R ear).     Physical Exam Updated Vital Signs BP (!) 139/92 (BP Location: Left Arm)   Pulse 88   Temp 98.4 F (36.9 C) (Oral)   Resp 16   Ht 6' (1.829 m)   Wt 88.9 kg (196 lb)   SpO2 97%   BMI 26.58 kg/m   Physical Exam  Constitutional: He appears well-developed and well-nourished.  Non-toxic appearance. No distress.  HENT:  Head: Normocephalic and atraumatic.  Right Ear: Tympanic membrane and external ear normal. No swelling or tenderness. No mastoid tenderness. Tympanic membrane is not perforated, not erythematous, not retracted and not bulging.  Left Ear: Tympanic membrane and external ear normal. No swelling or tenderness. No mastoid tenderness. Tympanic membrane is not perforated, not erythematous, not retracted and not bulging.  Nose: Mucosal edema (congestion)  present. Right sinus exhibits no maxillary sinus tenderness and no frontal sinus tenderness. Left sinus exhibits no maxillary sinus tenderness and no frontal sinus tenderness.  Mouth/Throat: Uvula is midline. Posterior oropharyngeal erythema present. No oropharyngeal exudate. Tonsils are 2+ on the right. Tonsils are 2+ on the left.  R ear does have 3-4 mm area of yellow honey crust appearance to posterior aspect of the upper ear, no surrounding erythema/warmth, no purulent drainage. NO induration/fluctuance. No mastoid erythema or  swelling.  Patient is tolerating his own secretions without difficulty.  No trismus.  No drooling.  No hot potato voice.  Submandibular compartment is soft.  Eyes: Conjunctivae and EOM are normal. Right eye exhibits no discharge. Left eye exhibits no discharge.  Neck: Normal range of motion. Neck supple. No neck rigidity.  No overlying swelling or skin changes.  Cardiovascular: Normal rate and regular rhythm.  No murmur heard. Pulmonary/Chest: Effort normal and breath sounds normal. No respiratory distress. He has no wheezes. He has no rales.  Abdominal: Soft. He exhibits no distension. There is no tenderness.  Lymphadenopathy:    He has cervical adenopathy (mild anterior).  Neurological: He is alert.  Clear speech.   Skin: Skin is warm and dry. No rash noted.  Psychiatric: He has a normal mood and affect. His behavior is normal. Thought content normal.  Nursing note and vitals reviewed.   ED Treatments / Results  Labs Results for orders placed or performed during the hospital encounter of 07/04/18  Rapid Strep Screen ([facility] ONLY)  Result Value Ref Range   Streptococcus, Group A Screen (Direct) NEGATIVE NEGATIVE   No results found. EKG None  Radiology No results found.  Procedures Procedures (including critical care time)  Medications Ordered in ED Medications - No data to display   Initial Impression / Assessment and Plan / ED Course  I have reviewed the triage vital signs and the nursing notes.  Pertinent labs & imaging results that were available during my care of the patient were reviewed by me and considered in my medical decision making (see chart for details).   Patient presents to the emergency department with URI symptoms for the past few days.  Patient nontoxic-appearing, no apparent distress, vitals WNL with the exception of elevated blood pressure, doubt HTN emergency, patient aware of need for recheck.  Patient's lungs are clear to auscultation bilaterally,  febrile, no evidence of respiratory distress, doubt pneumonia.  Symptoms have been ongoing less than 7 days, afebrile, no sinus tenderness, doubt acute bacterial sinusitis.  Rapid strep negative, culture pending, no evidence of RPA/PTA.  No evidence of acute otitis media, acute otitis externa, or mastoiditis on exam.  No meningeal signs.  Patient does appear to have a small area of impetigo to the posterior aspect of the right ear, this has been waxing and waning for several months, I do not necessarily feel this is contributory to his presenting symptoms today.  Does not appear to be surrounding cellulitis or an abscess, again no evidence of mastoiditis.  Will treat URI symptoms symptomatically, will treat impetigo with topical mupirocin. I discussed results, treatment plan, need for PCP follow-up, and return precautions with the patient. Provided opportunity for questions, patient confirmed understanding and is in agreement with plan.    Final Clinical Impressions(s) / ED Diagnoses   Final diagnoses:  Right ear pain  URI with cough and congestion  Impetigo    ED Discharge Orders        Ordered  mupirocin cream (BACTROBAN) 2 %  3 times daily     07/04/18 1138    benzonatate (TESSALON) 100 MG capsule  Every 8 hours     07/04/18 1138    fluticasone (FLONASE) 50 MCG/ACT nasal spray  Daily     07/04/18 1138    acetaminophen (TYLENOL 8 HOUR) 650 MG CR tablet  Every 8 hours PRN     07/04/18 [address], Pleas Koch, PA-C 07/04/18 1921    Gwyneth Sprout, MD 07/06/18 2015

## 2018-07-04 NOTE — Discharge Instructions (Addendum)
You were seen in the emergency department today for ear pain.  Your ear does not appear to have an acute infection on the inside.  Your strep test was negative.  We suspect her overall symptoms are related to a virus or to allergies.  We are treating the symptoms supportively with the following medicines: -tylenol- take this every 8 hours as needed for pain -Tessalon-this is a cough medicine, take this every 8 hours as needed for cough -Flonase this is for congestion, use this daily in each of your nostrils  Additionally the back of your ear looks like he has an infection called impetigo, please apply the topical mupirocin cream 3 times a day.  We have prescribed you new medication(s) today. Discuss the medications prescribed today with your pharmacist as they can have adverse effects and interactions with your other medicines including over the counter and prescribed medications. Seek medical evaluation if you start to experience new or abnormal symptoms after taking one of these medicines, seek care immediately if you start to experience difficulty breathing, feeling of your throat closing, facial swelling, or rash as these could be indications of a more serious allergic reaction  We would like you to have all of your symptoms rechecked by primary care provider within 5 days.  Return to the ER anytime for new or worsening symptoms including but not limited to worsening pain, drainage from the ear, swelling or redness behind the ear, vomiting, fever, or any other concerns.

## 2018-07-07 LAB — CULTURE, GROUP A STREP (THRC)

## 2022-01-16 DEATH — deceased
